# Patient Record
Sex: Female | Born: 1995 | Race: White | Hispanic: No | Marital: Single | Smoking: Never smoker
Health system: Southern US, Community
[De-identification: ages and names within clinical notes are randomized; demographics above are authoritative.]

## PROBLEM LIST (undated history)

## (undated) HISTORY — PX: APPENDECTOMY: SHX54

---
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
## 2014-06-30 ENCOUNTER — Ambulatory Visit: Payer: Self-pay

## 2014-10-30 ENCOUNTER — Ambulatory Visit (INDEPENDENT_AMBULATORY_CARE_PROVIDER_SITE_OTHER): Payer: BLUE CROSS/BLUE SHIELD | Admitting: Physician Assistant

## 2014-10-30 VITALS — BP 130/70 | HR 70 | Temp 98.0°F | Resp 14 | Ht 62.25 in | Wt 138.4 lb

## 2014-10-30 DIAGNOSIS — R300 Dysuria: Secondary | ICD-10-CM

## 2014-10-30 DIAGNOSIS — R350 Frequency of micturition: Secondary | ICD-10-CM | POA: Diagnosis not present

## 2014-10-30 DIAGNOSIS — M545 Low back pain, unspecified: Secondary | ICD-10-CM

## 2014-10-30 DIAGNOSIS — M62830 Muscle spasm of back: Secondary | ICD-10-CM

## 2014-10-30 DIAGNOSIS — R110 Nausea: Secondary | ICD-10-CM | POA: Diagnosis not present

## 2014-10-30 LAB — POCT UA - MICROSCOPIC ONLY
CRYSTALS, UR, HPF, POC: NEGATIVE
Casts, Ur, LPF, POC: NEGATIVE
Mucus, UA: NEGATIVE
Yeast, UA: NEGATIVE

## 2014-10-30 LAB — POCT URINALYSIS DIPSTICK
Bilirubin, UA: NEGATIVE
Glucose, UA: NEGATIVE
KETONES UA: NEGATIVE
Nitrite, UA: NEGATIVE
Spec Grav, UA: 1.02
Urobilinogen, UA: 0.2
pH, UA: 7

## 2014-10-30 LAB — POCT CBC
Granulocyte percent: 76.7 %G (ref 37–80)
HEMATOCRIT: 43.3 % (ref 37.7–47.9)
HEMOGLOBIN: 15.2 g/dL (ref 12.2–16.2)
Lymph, poc: 1.9 (ref 0.6–3.4)
MCH: 30.1 pg (ref 27–31.2)
MCHC: 35.1 g/dL (ref 31.8–35.4)
MCV: 85.8 fL (ref 80–97)
MID (cbc): 0.3 (ref 0–0.9)
MPV: 6.6 fL (ref 0–99.8)
PLATELET COUNT, POC: 319 10*3/uL (ref 142–424)
POC GRANULOCYTE: 7.2 — AB (ref 2–6.9)
POC LYMPH PERCENT: 20.5 %L (ref 10–50)
POC MID %: 2.8 %M (ref 0–12)
RBC: 5.04 M/uL (ref 4.04–5.48)
RDW, POC: 12.9 %
WBC: 9.4 10*3/uL (ref 4.6–10.2)

## 2014-10-30 MED ORDER — CIPROFLOXACIN HCL 500 MG PO TABS: 500.0000 mg | ORAL_TABLET | Freq: Two times a day (BID) | ORAL | Status: AC

## 2014-10-30 MED ORDER — CYCLOBENZAPRINE HCL 5 MG PO TABS: 5.0000 mg | ORAL_TABLET | Freq: Three times a day (TID) | ORAL | Status: AC | PRN

## 2014-10-30 NOTE — Progress Notes (Signed)
Subjective:    Patient ID: Jeanne Cox, female    DOB: 25-Jan-1996, 19 y.o.   MRN: [id number]  Chief Complaint  Patient presents with  . Back Pain  . Nausea  . Dysuria  . Urine odor  . Urinary Frequency   Medications, allergies, past medical history, surgical history, family history, social history and problem list reviewed and updated.  HPI  19 yof presents with above sx.   Mild dysuria, increased freq and odor past few days. Nausea past 2 days. Was sitting at work yest and had sudden onset right sided low back pain. Severe. No radiation. Has been fairly constant since onset. No sciatica. Pain does worsen with bending forward or turning to side.   Chills yest, no fevers. Diaphoresis today. No anti pyretics prior to visit today. Denies abd pain, diarrhea, emesis.   Review of Systems See HPI.     Objective:   Physical Exam  Constitutional: She is oriented to person, place, and time. She appears well-developed and well-nourished.  Non-toxic appearance. She does not have a sickly appearance. She does not appear ill. No distress.  BP 130/70 mmHg  Pulse 70  Temp(Src) 98 F (36.7 C) (Oral)  Resp 14  Ht 5' 2.25" (1.581 m)  Wt 138 lb 6.4 oz (62.778 kg)  BMI 25.12 kg/m2  SpO2 98%  LMP 10/09/2014   Abdominal: Soft. Normal appearance and bowel sounds are normal. There is no tenderness. There is CVA tenderness. There is no rigidity, no rebound, no guarding, no tenderness at McBurney's point and negative Murphy's sign.  Right sided cva tenderness.   Musculoskeletal:       Lumbar back: She exhibits tenderness and spasm.       Back:  Right lumbar ttp. Right lumbar small spasm.   Neurological: She is alert and oriented to person, place, and time.  Skin:  Diaphoretic on exam.   Psychiatric: She has a normal mood and affect. Her speech is normal and behavior is normal.   Results for orders placed or performed in visit on 10/30/14  POCT urinalysis dipstick  Result Value Ref  Range   Color, UA yellow    Clarity, UA cloudy    Glucose, UA neg    Bilirubin, UA neg    Ketones, UA neg    Spec Grav, UA 1.020    Blood, UA trace    pH, UA 7.0    Protein, UA trace    Urobilinogen, UA 0.2    Nitrite, UA neg    Leukocytes, UA large (3+) (A) Negative  POCT UA - Microscopic Only  Result Value Ref Range   WBC, Ur, HPF, POC 25-30    RBC, urine, microscopic 2-3    Bacteria, U Microscopic 3+    Mucus, UA neg    Epithelial cells, urine per micros 2-4    Crystals, Ur, HPF, POC neg    Casts, Ur, LPF, POC neg    Yeast, UA neg   POCT CBC  Result Value Ref Range   WBC 9.4 4.6 - 10.2 K/uL   Lymph, poc 1.9 0.6 - 3.4   POC LYMPH PERCENT 20.5 10 - 50 %L   MID (cbc) 0.3 0 - 0.9   POC MID % 2.8 0 - 12 %M   POC Granulocyte 7.2 (A) 2 - 6.9   Granulocyte percent 76.7 37 - 80 %G   RBC 5.04 4.04 - 5.48 M/uL   Hemoglobin 15.2 12.2 - 16.2 g/dL   HCT, POC 43.3  37.7 - 47.9 %   MCV 85.8 80 - 97 fL   MCH, POC 30.1 27 - 31.2 pg   MCHC 35.1 31.8 - 35.4 g/dL   RDW, POC 16.1 %   Platelet Count, POC 319 142 - 424 K/uL   MPV 6.6 0 - 99.8 fL      Assessment & Plan:   Dysuria - Plan: POCT urinalysis dipstick, POCT UA - Microscopic Only, POCT CBC, Urine culture Right-sided low back pain without sciatica Urinary frequency Nausea without vomiting Muscle spasm of back --uti on ua, possible pyelo with recent nausea, cva tenderness today, diaphoresis today, though she has normal vitals and no wbc casts --tx for uti/possible pyelo with cipro bid 7 days, urine cx sent will adjust treatment accordingly --no leukocytosis --small spasm right lumbar likely cause of right sided low back pain --> cycle tylenol/ibuprofen, flexeril prn, heat, light massage --rtc if no relief 2-3 days  Donnajean Lopes, PA-C Physician Assistant-Certified [facility]  10/30/2014 8:44 PM

## 2014-10-30 NOTE — Patient Instructions (Addendum)
You have a uti with possible infection in the kidney.  Please take the cipro twice daily for 7 days.  Please cycle between tylenol 500 mg and ibuprofen 600 mg every 4 hours as needed for pain.  Applying heat to the area will help with the pain.  You do have a small spasm in the area, taking the flexeril every 8 hours as needed will help ease up the spasm. Keep in mind this can make you drowsy.  We sent a urine culture today and will let you know if we need to change the antibiotic.

## 2014-11-03 LAB — URINE CULTURE: Colony Count: >=100000
